# Patient Record
Sex: Female | Born: 1935 | Race: White | Hispanic: No
Health system: Southern US, Community
[De-identification: ages and names within clinical notes are randomized; demographics above are authoritative.]

---

## 2019-01-06 ENCOUNTER — Emergency Department (HOSPITAL_COMMUNITY)
Admission: EM | Admit: 2019-01-06 | Discharge: 2019-01-06 | Disposition: A | Discharge status: Home / Self Care | Payer: Medicare (Managed Care) | Attending: Emergency Medicine | Admitting: Emergency Medicine

## 2019-01-06 ENCOUNTER — Emergency Department (HOSPITAL_COMMUNITY): Payer: Medicare (Managed Care)

## 2019-01-06 ENCOUNTER — Other Ambulatory Visit: Payer: Self-pay

## 2019-01-06 DIAGNOSIS — Y92122 Bedroom in nursing home as the place of occurrence of the external cause: Secondary | ICD-10-CM | POA: Diagnosis not present

## 2019-01-06 DIAGNOSIS — W19XXXA Unspecified fall, initial encounter: Secondary | ICD-10-CM

## 2019-01-06 DIAGNOSIS — F0390 Unspecified dementia without behavioral disturbance: Secondary | ICD-10-CM | POA: Diagnosis not present

## 2019-01-06 DIAGNOSIS — Y9384 Activity, sleeping: Secondary | ICD-10-CM | POA: Diagnosis not present

## 2019-01-06 DIAGNOSIS — S0083XA Contusion of other part of head, initial encounter: Secondary | ICD-10-CM | POA: Diagnosis present

## 2019-01-06 DIAGNOSIS — W06XXXA Fall from bed, initial encounter: Secondary | ICD-10-CM | POA: Diagnosis not present

## 2019-01-06 DIAGNOSIS — Z20828 Contact with and (suspected) exposure to other viral communicable diseases: Secondary | ICD-10-CM | POA: Diagnosis not present

## 2019-01-06 DIAGNOSIS — M546 Pain in thoracic spine: Secondary | ICD-10-CM

## 2019-01-06 DIAGNOSIS — Y999 Unspecified external cause status: Secondary | ICD-10-CM | POA: Diagnosis not present

## 2019-01-06 LAB — POC SARS CORONAVIRUS 2 AG -  ED: SARS Coronavirus 2 Ag: NEGATIVE

## 2019-01-06 NOTE — ED Provider Notes (Signed)
[facility] EMERGENCY DEPARTMENT Provider Note   CSN: [id number] Arrival date & time: 01/06/19  0132     History Chief Complaint  Patient presents with  . Fall    Cindy Jennings is a 85 y.o. female.  Patient to ED from [facility] after unwitnessed fall tonight from bed. Per EMS report, the staff heard her fall and found her on the floor, awake. She complained of back pain to EMS and has obvious head trauma with hematoma to forehead. There has been no nausea or vomiting. The patient is reported to be COVID positive but where and when these test results were obtained was not available from nursing home staff. She is taking Eliquis. The patient has a history of dementia and is reported to be at her baseline mental status, per EMS.  The history is provided by the patient. No language interpreter was used.       No past medical history on file.  There are no problems to display for this patient.   OB History   No obstetric history on file.     No family history on file.  Social History   Tobacco Use  . Smoking status: Not on file  Substance Use Topics  . Alcohol use: Not on file  . Drug use: Not on file    Home Medications Prior to Admission medications   Not on File    Allergies    Patient has no known allergies.  Review of Systems   Review of Systems  Unable to perform ROS: Dementia    Physical Exam Updated Vital Signs BP (!) 120/58 (BP Location: Left Arm)   Pulse (!) 48   Temp 98.1 F (36.7 C) (Oral)   Resp 17   SpO2 98%   Physical Exam Constitutional:      General: She is not in acute distress. HENT:     Head: Normocephalic.     Comments: Large hematoma to left forehead. No open wound.    Nose: Nose normal.     Mouth/Throat:     Mouth: Mucous membranes are moist.  Eyes:     Conjunctiva/sclera: Conjunctivae normal.  Neck:     Comments: Not in collar on arrival.  Cardiovascular:     Rate and Rhythm: Normal rate  and regular rhythm.  Pulmonary:     Effort: Pulmonary effort is normal. No respiratory distress.     Breath sounds: No wheezing, rhonchi or rales.  Abdominal:     General: Abdomen is flat.     Tenderness: There is no abdominal tenderness.     Comments: No abdominal wall bruising.  Musculoskeletal:     Cervical back: Normal range of motion and neck supple.     Comments: Right AKA. Moves all extremities without limitation and on specific command. No bruising or wound. There is thoracic tenderness without obvious trauma to the back.   Skin:    General: Skin is warm and dry.     Findings: Bruising (Left forehead.) present.  Neurological:     Mental Status: She is alert. Mental status is at baseline.     Comments: Baseline as reported.     ED Results / Procedures / Treatments   Labs (all labs ordered are listed, but only abnormal results are displayed) Labs Reviewed  POC SARS CORONAVIRUS 2 AG -  ED    EKG None  Radiology No results found. DG Thoracic Spine 2 View  Result Date: 01/06/2019 CLINICAL DATA:  Fall EXAM: THORACIC SPINE 2 VIEWS COMPARISON:  None. FINDINGS: Thoracic alignment is within normal limits. Minimal wedging at approximate T9-T10 level of uncertain chronicity. IMPRESSION: Minimal wedging at approximate T9 and T10 of uncertain chronicity. There are mild degenerative changes Electronically Signed   By: Jasmine PangKim  Fujinaga M.D.   On: 01/06/2019 03:07   CT Head Wo Contrast  Result Date: 01/06/2019 CLINICAL DATA:  Headache fell off bed hematoma to left forehead EXAM: CT HEAD WITHOUT CONTRAST CT CERVICAL SPINE WITHOUT CONTRAST TECHNIQUE: Multidetector CT imaging of the head and cervical spine was performed following the standard protocol without intravenous contrast. Multiplanar CT image reconstructions of the cervical spine were also generated. COMPARISON:  CT brain 07/16/2018, CT chest 07/16/2018 FINDINGS: CT HEAD FINDINGS Brain: No acute territorial infarction, hemorrhage or  intracranial mass. Encephalomalacia at the left cranial vertex. Advanced atrophy and small vessel ischemic changes of the white matter. Stable enlarged ventricles felt secondary to atrophy. 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 Vascular: No hyperdense vessels.  Carotid vascular calcification Skull: Normal. Negative for fracture or focal lesion. Fluid within the bilateral mastoids Sinuses/Orbits: No acute finding. Other: Large left forehead scalp hematoma CT CERVICAL SPINE FINDINGS Alignment: No subluxation.  Facet alignment within normal limits Skull base and vertebrae: No acute fracture. No primary bone lesion or focal pathologic process. Soft tissues and spinal canal: No prevertebral fluid or swelling. No visible canal hematoma. Disc levels: Mild disc space narrowing C3-C4 and C5-C6. Posterior disc osteophyte at C5-C6. Facet degenerative change at multiple levels. Upper chest: Left apical scarring. Mild hyperdense focus in the right thyroid is unchanged. Other: None IMPRESSION: 1. No CT evidence for acute intracranial abnormality. Atrophy and chronic small vessel ischemic change of the white matter. Encephalomalacia at the left cranial vertex 2. Degenerative changes of the cervical spine. No acute osseous abnormality Electronically Signed   By: Jasmine PangKim  Fujinaga M.D.   On: 01/06/2019 03:05   CT Cervical Spine Wo Contrast  Result Date: 01/06/2019 CLINICAL DATA:  Headache fell off bed hematoma to left forehead EXAM: CT HEAD WITHOUT CONTRAST CT CERVICAL SPINE WITHOUT CONTRAST TECHNIQUE: Multidetector CT imaging of the head and cervical spine was performed following the standard protocol without intravenous contrast. Multiplanar CT image reconstructions of the cervical spine were also generated. COMPARISON:  CT brain 07/16/2018, CT chest 07/16/2018 FINDINGS: CT HEAD FINDINGS Brain: No acute territorial infarction, hemorrhage or intracranial mass. Encephalomalacia at the left cranial vertex. Advanced  atrophy and small vessel ischemic changes of the white matter. Stable enlarged ventricles felt secondary to atrophy. 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 Vascular: No hyperdense vessels.  Carotid vascular calcification Skull: Normal. Negative for fracture or focal lesion. Fluid within the bilateral mastoids Sinuses/Orbits: No acute finding. Other: Large left forehead scalp hematoma CT CERVICAL SPINE FINDINGS Alignment: No subluxation.  Facet alignment within normal limits Skull base and vertebrae: No acute fracture. No primary bone lesion or focal pathologic process. Soft tissues and spinal canal: No prevertebral fluid or swelling. No visible canal hematoma. Disc levels: Mild disc space narrowing C3-C4 and C5-C6. Posterior disc osteophyte at C5-C6. Facet degenerative change at multiple levels. Upper chest: Left apical scarring. Mild hyperdense focus in the right thyroid is unchanged. Other: None IMPRESSION: 1. No CT evidence for acute intracranial abnormality. Atrophy and chronic small vessel ischemic change of the white matter. Encephalomalacia at the left cranial vertex 2. Degenerative changes of the cervical spine. No acute osseous abnormality Electronically Signed   By: Adrian ProwsKim  Fujinaga M.D.  On: 01/06/2019 03:05    Procedures Procedures (including critical care time)  Medications Ordered in ED Medications - No data to display  ED Course  I have reviewed the triage vital signs and the nursing notes.  Pertinent labs & imaging results that were available during my care of the patient were reviewed by me and considered in my medical decision making (see chart for details).    MDM Rules/Calculators/A&P                      Patient to ED after unwitnessed fall at [facility] as detailed in HPI.   The patient is awake, alert, follows command. No extremity injury is appreciated. Rapid COVID test in ED is negative. CT head and neck reveal no acute findings. There is a minimal wedging  deformity of T9-T10, unclear whether it is new or old.   The patient can be returned to the NF. She has been seen by Dr. Betsey Holiday and is felt appropriate for discharge. Final Clinical Impression(s) / ED Diagnoses Final diagnoses:  None   1. Fall 2. Facial contusion  Rx / DC Orders ED Discharge Orders    None       Charlann Lange, PA-C 01/06/19 0536    Orpah Greek, MD 01/07/19 [phone]

## 2019-01-06 NOTE — ED Notes (Signed)
Ptar called for pt 

## 2019-01-06 NOTE — ED Notes (Signed)
Patient verbalizes understanding of discharge instructions. Opportunity for questioning and answers were provided. Armband removed by staff, pt discharged from ED with PTAR and transported back to [facility]

## 2019-01-06 NOTE — ED Triage Notes (Signed)
Pt is a right BKA and per nursing facility, pt rolled off the bed. Large hematoma to left forehead. States no LOC. Currently takes Eloquis. Pt is able to answer some questions. States that she has chronic back pain. Pt is very hard of hearing and you must yell/speak loudly for pt to hear.

## 2019-01-06 NOTE — ED Notes (Signed)
PTAR to transport pt to facility now

## 2019-01-06 NOTE — Discharge Instructions (Addendum)
A CT head and neck, and a thoracic x-ray were performed to evaluate for injury during an unwitnessed fall. All were without significant finding. The thoracic x-ray does show a minimal wedging deformity T9-T10 but is unclear whether this a new or old injury. Supportive care is recommended.   Please have the patient's physician recheck her as needed. You can continue her regular medications.

## 2019-01-06 NOTE — ED Notes (Signed)
Report given to [place] RN. All questions answered 

## 2019-02-08 DEATH — deceased

## 2020-10-11 IMAGING — CT CT CERVICAL SPINE W/O CM
3 of 8 series · 11 of 33 positions shown, 13 images · non-contrast
Comparison: CT brain 07/16/2018, CT chest 07/16/2018

CLINICAL DATA: Headache fell off bed hematoma to left forehead

EXAM:
CT HEAD WITHOUT CONTRAST
CT CERVICAL SPINE WITHOUT CONTRAST
TECHNIQUE: Multidetector CT imaging of the head and cervical spine was
performed following the standard protocol without intravenous
contrast. Multiplanar CT image reconstructions of the cervical spine
were also generated.

[Series 10: c_spine 2.0 sag bone · sagittal · 0.24mm/px · 5 of 61 slices shown]
[im 11/61  bone]
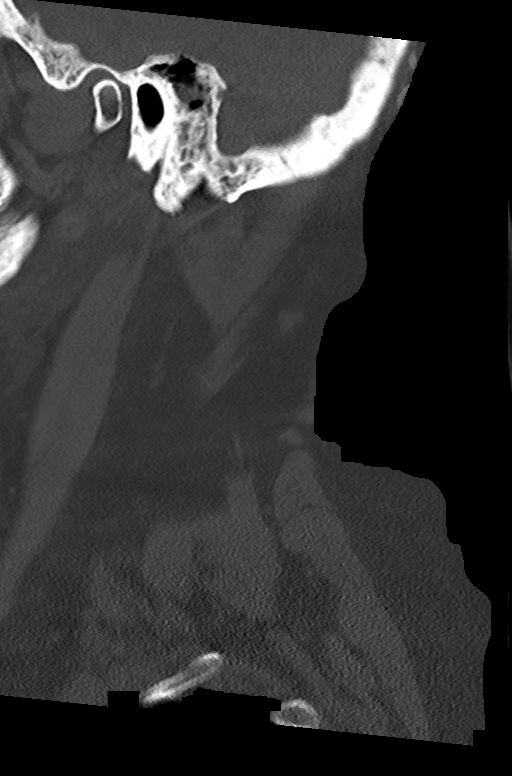
[im 21/61  bone]
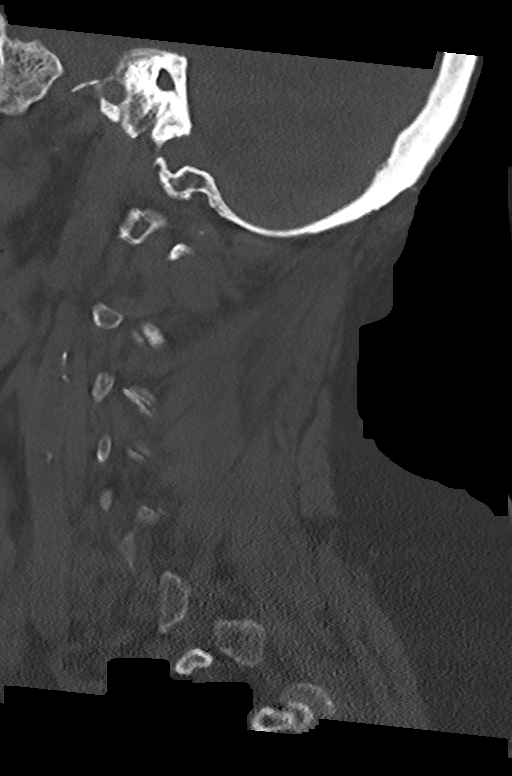
[im 31/61  bone]
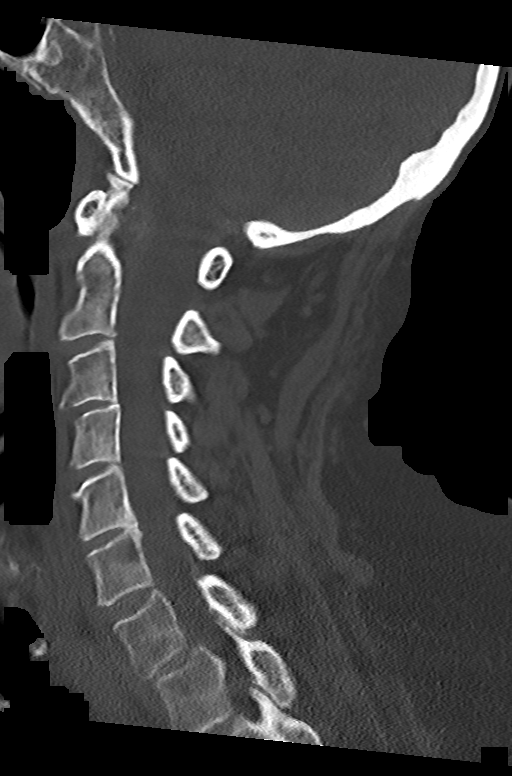
[im 41/61  bone]
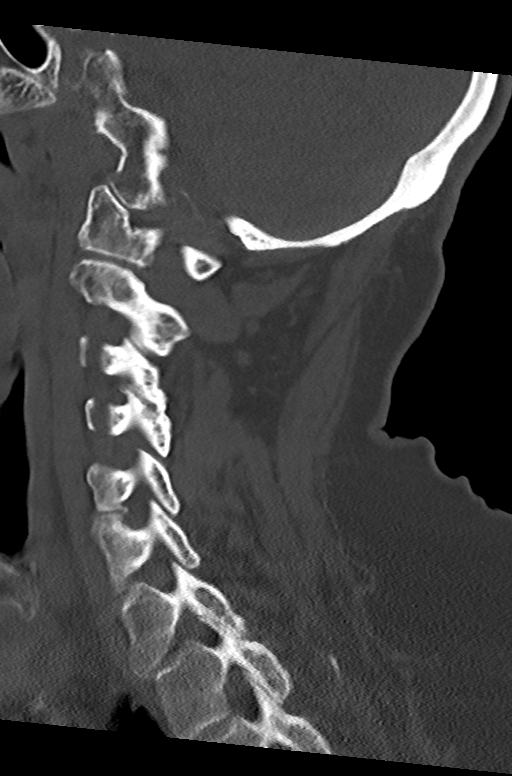
[im 51/61  bone]
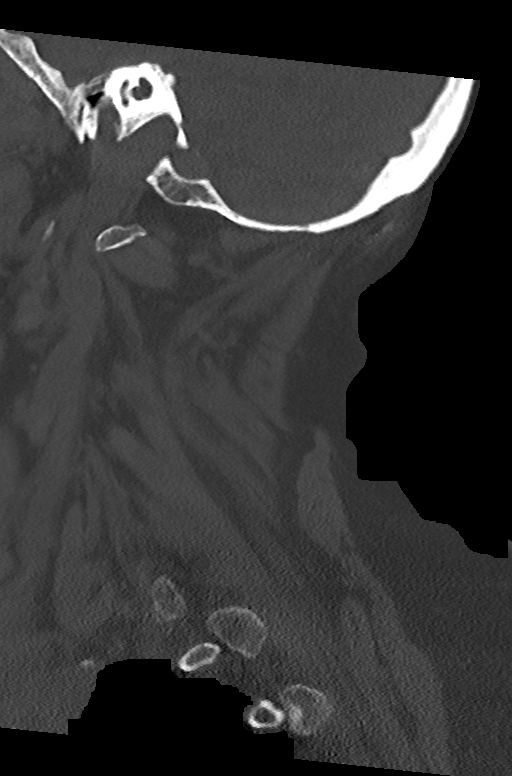

[Series 11: c_spine 2.0 cor bone · coronal · 0.20mm/px · 1 of 61 slices shown]
[im 31/61  bone]
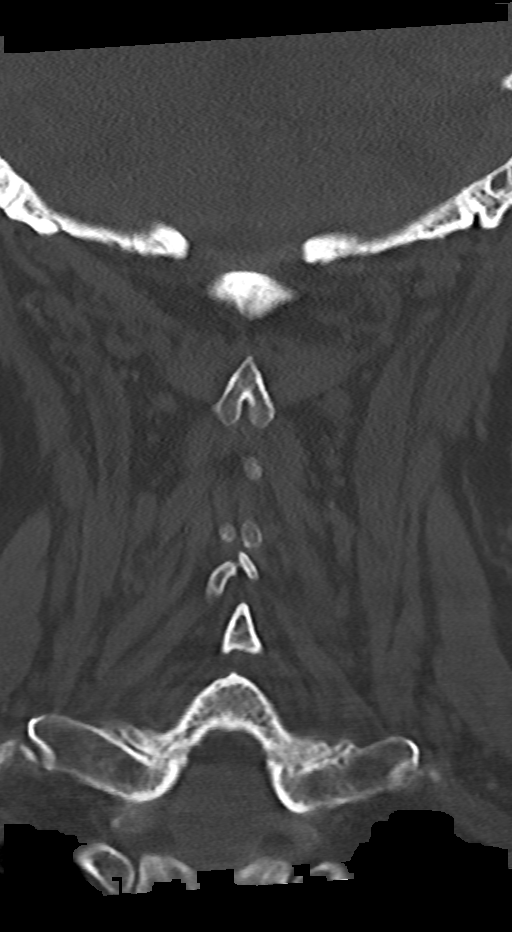

[Series 13: c_spine 1.0 st thins · axial · 0.26mm/px · z∈[-231,-122]mm · 5 of 236 slices shown, 7 images]
[im 40/236  soft-tissue]
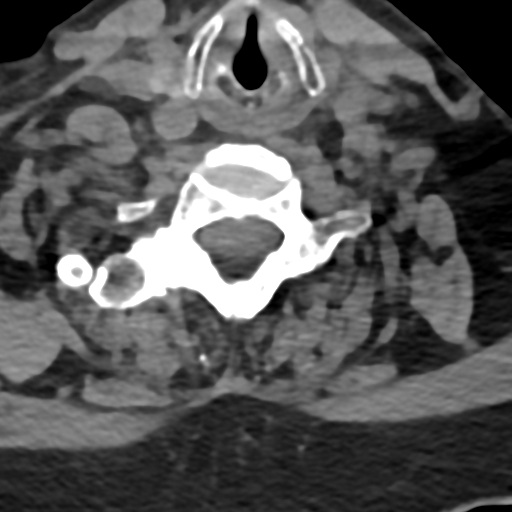
[im 40/236  bone]
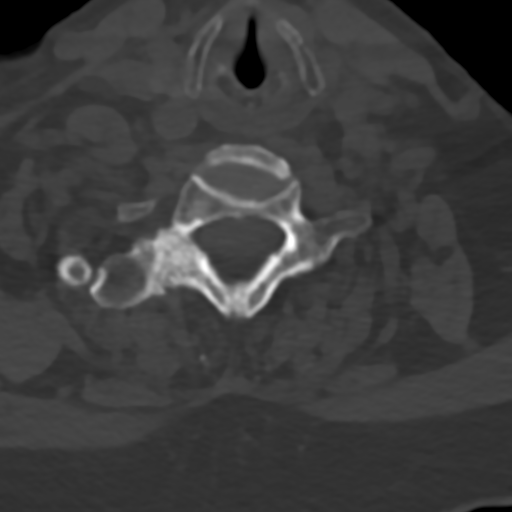
[im 79/236  bone]
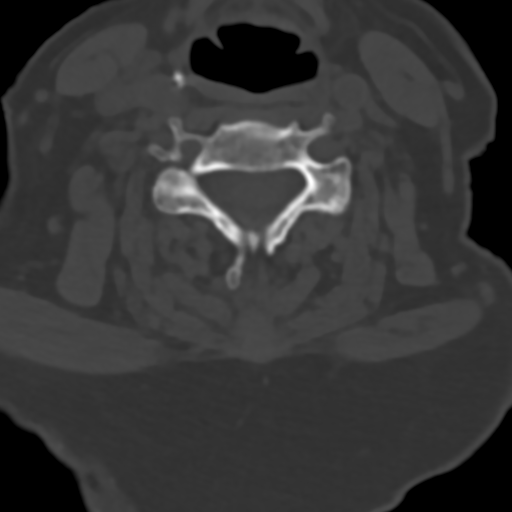
[im 118/236  bone]
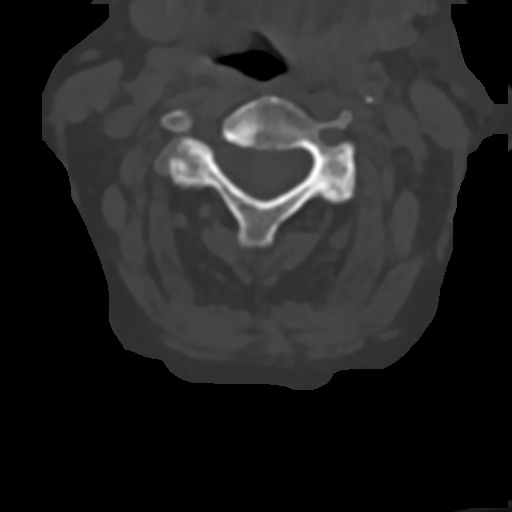
[im 157/236  bone]
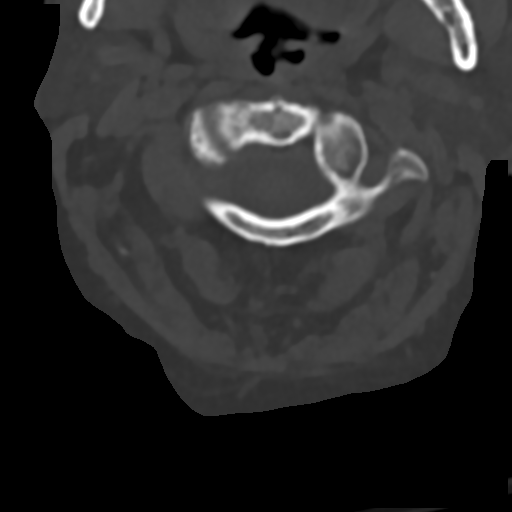
[im 196/236  soft-tissue]
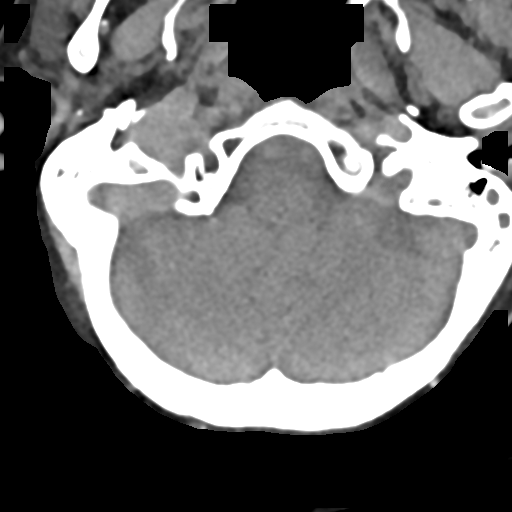
[im 196/236  bone]
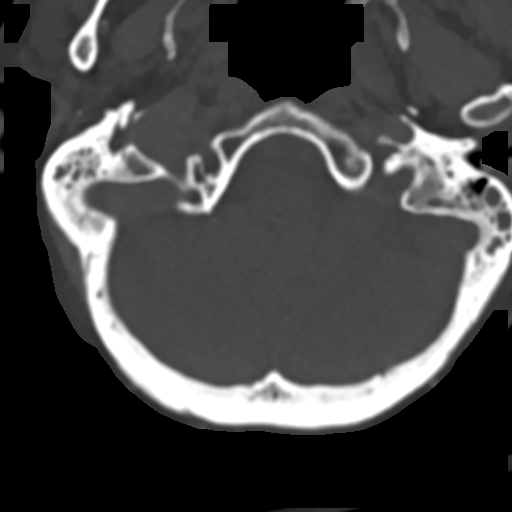

[11 of 33 positions shown; findings below may reference images not displayed]

FINDINGS: CT HEAD FINDINGS

Brain: No acute territorial infarction, hemorrhage or intracranial
mass. Encephalomalacia at the left cranial vertex. Advanced atrophy
and small vessel ischemic changes of the white matter. Stable
enlarged ventricles felt secondary to atrophy. Chronic lacunar
infarcts in the right cerebellum.

Vascular: No hyperdense vessels.  Carotid vascular calcification

Skull: Normal. Negative for fracture or focal lesion. Fluid within
the bilateral mastoids

Sinuses/Orbits: No acute finding.

Other: Large left forehead scalp hematoma

CT CERVICAL SPINE FINDINGS

Alignment: No subluxation.  Facet alignment within normal limits

Skull base and vertebrae: No acute fracture. No primary bone lesion
or focal pathologic process.

Soft tissues and spinal canal: No prevertebral fluid or swelling. No
visible canal hematoma.

Disc levels: Mild disc space narrowing C3-C4 and C5-C6. Posterior
disc osteophyte at C5-C6. Facet degenerative change at multiple
levels.

Upper chest: Left apical scarring. Mild hyperdense focus in the
right thyroid is unchanged.

Other: None
IMPRESSION: 1. No CT evidence for acute intracranial abnormality. Atrophy and
chronic small vessel ischemic change of the white matter.
Encephalomalacia at the left cranial vertex
2. Degenerative changes of the cervical spine. No acute osseous
abnormality

## 2020-10-11 IMAGING — DX DG THORACIC SPINE 2V
2 series · 2 of 2 positions shown · non-contrast
Comparison: None.

CLINICAL DATA: Fall

EXAM:
THORACIC SPINE 2 VIEWS

[t-spine ap]
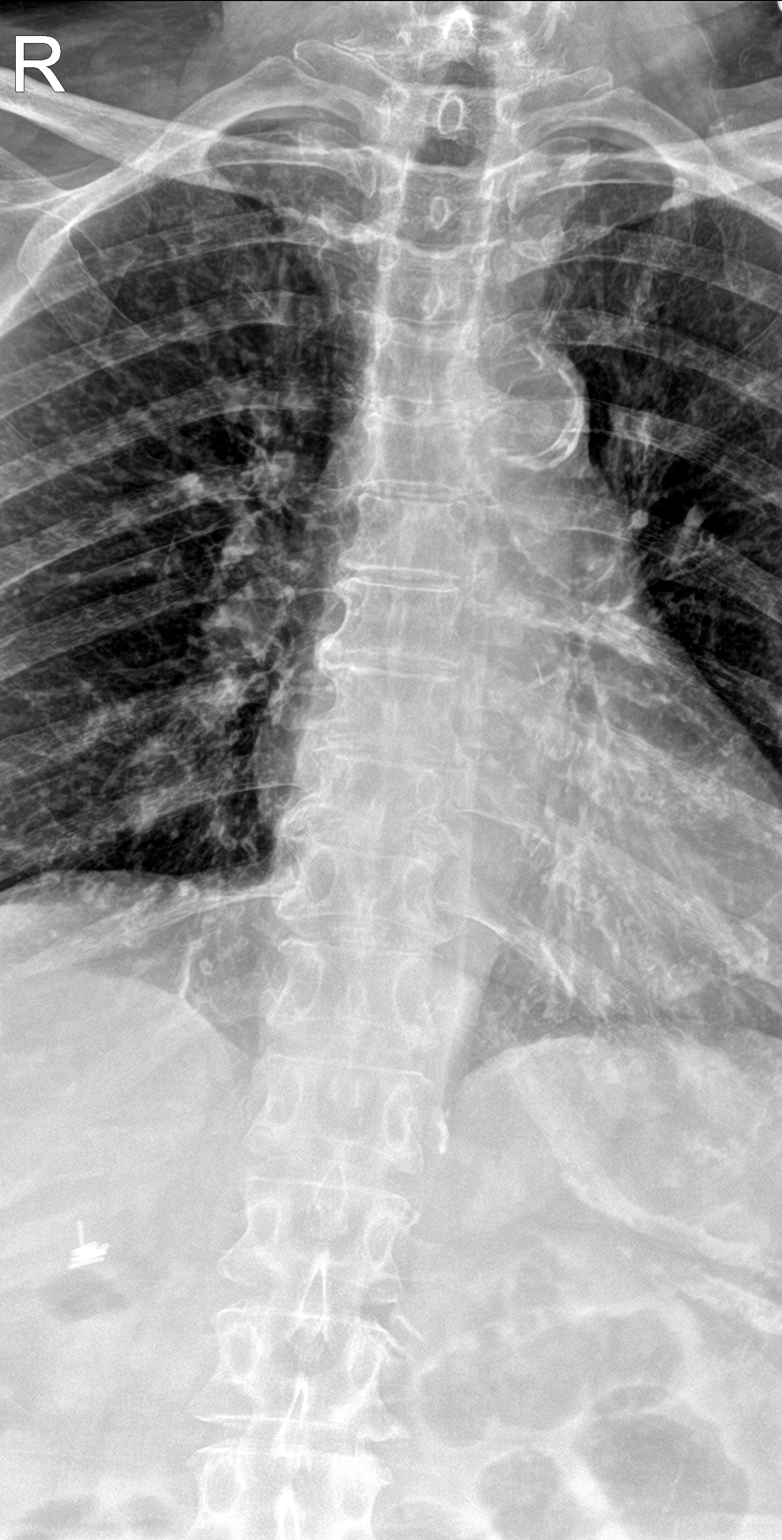

[t-spine lat]
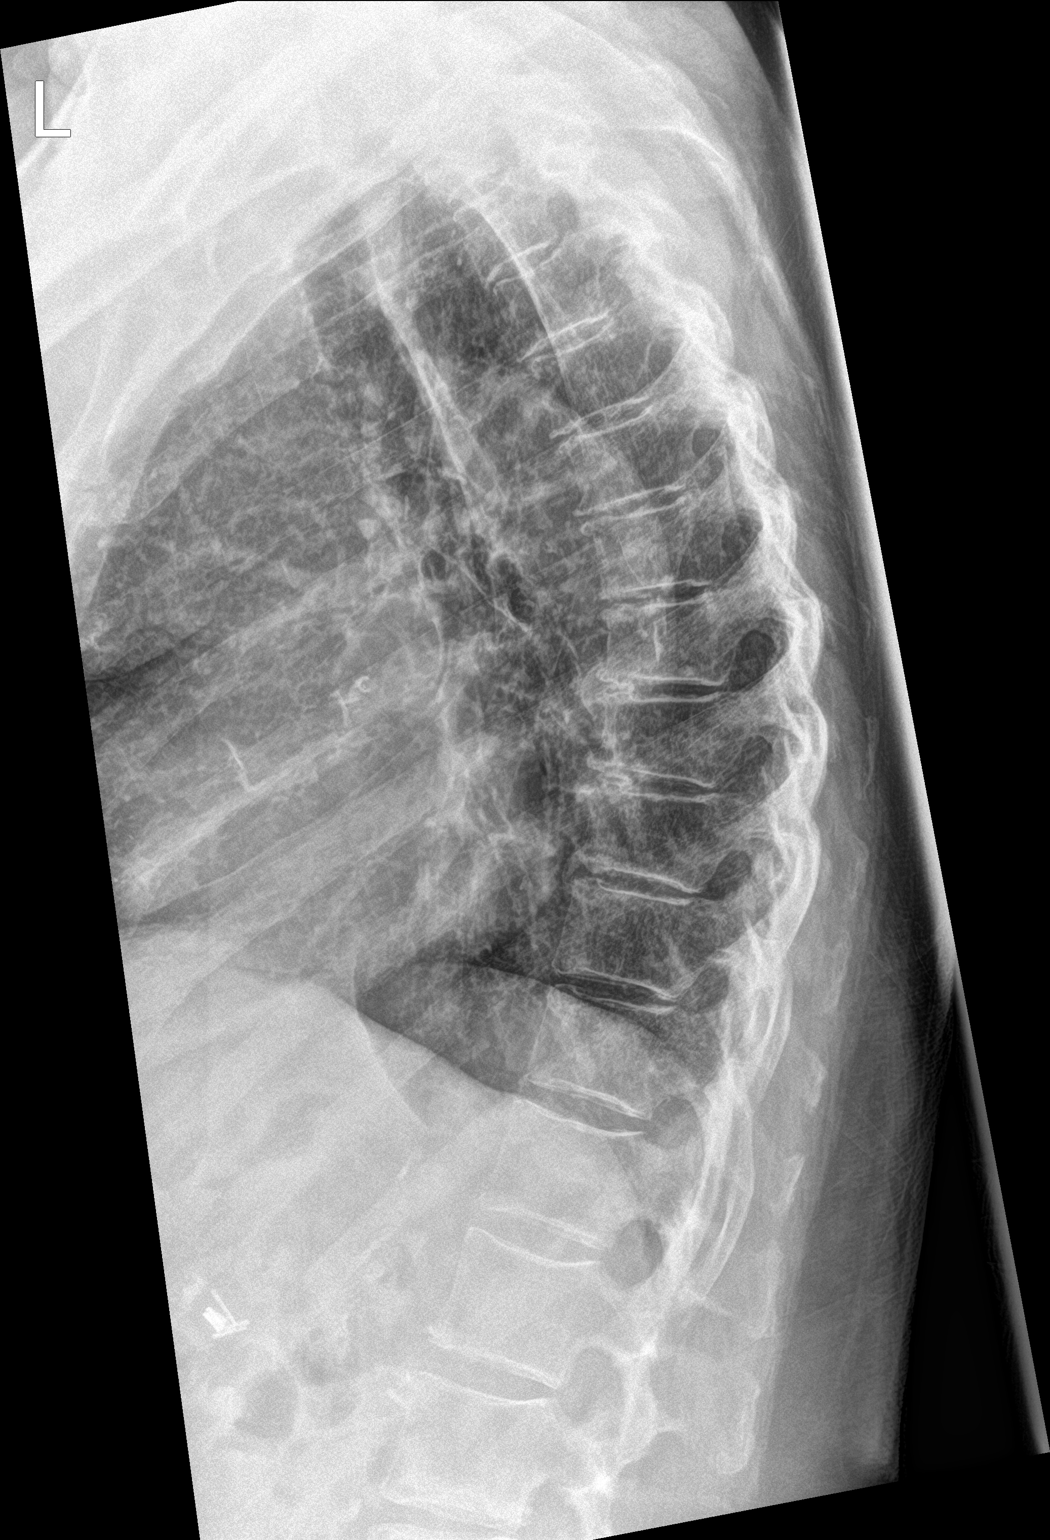

[2 of 2 positions shown; findings below may reference images not displayed]

FINDINGS: Thoracic alignment is within normal limits. Minimal wedging at
approximate T9-T10 level of uncertain chronicity.
IMPRESSION: Minimal wedging at approximate T9 and T10 of uncertain chronicity.
There are mild degenerative changes
